# Patient Record
Sex: Female | Born: 1985 | Race: Black or African American | Hispanic: No | Marital: Single | Smoking: Never smoker
Health system: Southern US, Community
[De-identification: ages and names within clinical notes are randomized; demographics above are authoritative.]

## PROBLEM LIST (undated history)

## (undated) DIAGNOSIS — R42 Dizziness and giddiness: Secondary | ICD-10-CM

## (undated) HISTORY — PX: TUBAL LIGATION: SHX77

---

## 2008-08-03 ENCOUNTER — Ambulatory Visit: Payer: Self-pay | Admitting: Diagnostic Radiology

## 2008-08-03 ENCOUNTER — Emergency Department (HOSPITAL_BASED_OUTPATIENT_CLINIC_OR_DEPARTMENT_OTHER): Admission: EM | Admit: 2008-08-03 | Discharge: 2008-08-03 | Payer: Self-pay | Admitting: Emergency Medicine

## 2008-10-02 ENCOUNTER — Emergency Department (HOSPITAL_BASED_OUTPATIENT_CLINIC_OR_DEPARTMENT_OTHER): Admission: EM | Admit: 2008-10-02 | Discharge: 2008-10-02 | Payer: Self-pay | Admitting: Emergency Medicine

## 2008-12-25 ENCOUNTER — Emergency Department (HOSPITAL_BASED_OUTPATIENT_CLINIC_OR_DEPARTMENT_OTHER): Admission: EM | Admit: 2008-12-25 | Discharge: 2008-12-25 | Payer: Self-pay | Admitting: Emergency Medicine

## 2009-01-14 ENCOUNTER — Ambulatory Visit: Payer: Self-pay | Admitting: Diagnostic Radiology

## 2009-01-14 ENCOUNTER — Emergency Department (HOSPITAL_BASED_OUTPATIENT_CLINIC_OR_DEPARTMENT_OTHER): Admission: EM | Admit: 2009-01-14 | Discharge: 2009-01-14 | Payer: Self-pay | Admitting: Emergency Medicine

## 2009-07-21 ENCOUNTER — Ambulatory Visit (HOSPITAL_COMMUNITY): Admission: RE | Admit: 2009-07-21 | Discharge: 2009-07-21 | Payer: Self-pay | Admitting: Obstetrics and Gynecology

## 2009-07-26 ENCOUNTER — Ambulatory Visit (HOSPITAL_COMMUNITY): Admission: RE | Admit: 2009-07-26 | Discharge: 2009-07-26 | Payer: Self-pay | Admitting: Obstetrics and Gynecology

## 2009-07-28 ENCOUNTER — Ambulatory Visit (HOSPITAL_COMMUNITY): Admission: RE | Admit: 2009-07-28 | Discharge: 2009-07-28 | Payer: Self-pay | Admitting: Obstetrics and Gynecology

## 2009-08-05 ENCOUNTER — Ambulatory Visit (HOSPITAL_COMMUNITY): Admission: RE | Admit: 2009-08-05 | Discharge: 2009-08-05 | Payer: Self-pay | Admitting: Obstetrics and Gynecology

## 2010-02-26 ENCOUNTER — Encounter: Payer: Self-pay | Admitting: Obstetrics and Gynecology

## 2010-03-10 ENCOUNTER — Ambulatory Visit (HOSPITAL_COMMUNITY)
Admission: RE | Admit: 2010-03-10 | Discharge: 2010-03-10 | Disposition: A | Discharge status: Home / Self Care | Payer: Medicaid Other | Source: Ambulatory Visit | Attending: Obstetrics and Gynecology | Admitting: Obstetrics and Gynecology

## 2010-03-10 DIAGNOSIS — N751 Abscess of Bartholin's gland: Secondary | ICD-10-CM | POA: Insufficient documentation

## 2010-03-10 LAB — TYPE AND SCREEN
ABO/RH(D): B POS
Antibody Screen: NEGATIVE

## 2010-03-11 LAB — GC/CHLAMYDIA PROBE AMP, GENITAL
Chlamydia, DNA Probe: NEGATIVE
GC Probe Amp, Genital: NEGATIVE

## 2010-03-13 LAB — WOUND CULTURE: Gram Stain: NONE SEEN

## 2010-03-30 NOTE — Op Note (Signed)
  NAMEINITA, URAM            ACCOUNT NO.:  [id number]  MEDICAL RECORD NO.:  [id number]           PATIENT TYPE:  O  LOCATION:  [facility]                          FACILITY:  [facility]  PHYSICIAN:  Pieter Partridge, MD   DATE OF BIRTH:  1985-02-22  DATE OF PROCEDURE:  03/10/2010 DATE OF DISCHARGE:                              OPERATIVE REPORT   PREOPERATIVE DIAGNOSIS:  Right Bartholin cyst.  POSTOPERATIVE DIAGNOSIS:  Right Bartholin cyst.  PROCEDURE:  Bartholin marsupialization.  SURGEON:  Pieter Partridge, MD  ASSISTANT:  Technician.  ANESTHESIA:  General, local.  FINDINGS:  Yellow mucoid discharge expelled from 3-cm cyst.  Gonorrhea and Chlamydia anaerobic cultures collected  ESTIMATED BLOOD LOSS:  Minimal.  COMPLICATIONS:  None.  The patient to PACU in stable condition.  INDICATIONS:  Ms. Manges is a 25 year old gravida 2, para 2 who 2 weeks ago had a right Bartholin abscess.  She went to an Urgent Care and had it drained.  She presented back to the office for annual exam and still there was mild discomfort, this is her third cyst.  She desired definitive management.  PROCEDURE IN DETAIL:  She was identified in the holding area and taken to the operating room with IV running.  She was then prepped and draped in a normal sterile fashion in the dorsal lithotomy position.  Her bladder was emptied and 150 mL of an adequate amount was returned.  Attention was turned to her right labia.  Her Bartholin cyst was identified and less than 5 mL of 1% lidocaine was used to inject the area.  Scalpel was then used to incise the cyst and the incision was then extended with the Metzenbaum scissors.  There was copious amount of yellow mucoid discharge that was expelled.  Cultures were obtained.  The cyst was explored and opened in its entirety.  The cyst wall and the mucosa were then reapproximated in a running fashion with 2-0 Vicryl and this was done on each side of the cyst  wall.  The area was then irrigated.  There was no bleeding noted at the end of the case.  The patient tolerated the procedure well.  She received Ancef 1 g prior to procedure.  She did have SCDs on.  All instrument, sponge, and needle counts were correct x3.  The patient was taken to the operating room in stable condition.     Pieter Partridge, MD     EBV/MEDQ  D:  03/10/2010  T:  03/11/2010  Job:  [id number]  Electronically Signed by Geryl Rankins MD on 03/30/2010 08:37:17 AM

## 2010-05-09 LAB — CBC
MCHC: 33.8 g/dL (ref 30.0–36.0)
MCV: 87.2 fL (ref 78.0–100.0)
Platelets: 141 10*3/uL — ABNORMAL LOW (ref 150–400)
RBC: 3.76 MIL/uL — ABNORMAL LOW (ref 3.87–5.11)

## 2010-05-09 LAB — DIFFERENTIAL
Basophils Absolute: 0.1 10*3/uL (ref 0.0–0.1)
Eosinophils Absolute: 0 10*3/uL (ref 0.0–0.7)
Lymphocytes Relative: 23 % (ref 12–46)
Monocytes Relative: 7 % (ref 3–12)

## 2010-05-09 LAB — URINE MICROSCOPIC-ADD ON

## 2010-05-09 LAB — BASIC METABOLIC PANEL
BUN: 14 mg/dL (ref 6–23)
CO2: 26 mEq/L (ref 19–32)
Creatinine, Ser: 0.8 mg/dL (ref 0.4–1.2)
GFR calc non Af Amer: >60 mL/min (ref >60)
Potassium: 3.9 mEq/L (ref 3.5–5.1)
Sodium: 140 mEq/L (ref 135–145)

## 2010-05-09 LAB — URINALYSIS, ROUTINE W REFLEX MICROSCOPIC
Bilirubin Urine: NEGATIVE
Ketones, ur: NEGATIVE mg/dL
Protein, ur: 100 mg/dL — AB
pH: 6.5 (ref 5.0–8.0)

## 2010-05-09 LAB — WET PREP, GENITAL

## 2010-05-09 LAB — ABO/RH: ABO/RH(D): B POS

## 2010-05-09 LAB — GC/CHLAMYDIA PROBE AMP, GENITAL: Chlamydia, DNA Probe: NEGATIVE

## 2010-05-10 LAB — URINALYSIS, ROUTINE W REFLEX MICROSCOPIC
Bilirubin Urine: NEGATIVE
Ketones, ur: NEGATIVE mg/dL
Nitrite: NEGATIVE
Specific Gravity, Urine: 1.021 (ref 1.005–1.030)
Urobilinogen, UA: 0.2 mg/dL (ref 0.0–1.0)

## 2010-05-10 LAB — URINE MICROSCOPIC-ADD ON

## 2010-05-10 LAB — PREGNANCY, URINE: Preg Test, Ur: POSITIVE

## 2010-05-13 LAB — PREGNANCY, URINE: Preg Test, Ur: NEGATIVE

## 2010-05-13 LAB — URINALYSIS, ROUTINE W REFLEX MICROSCOPIC
Nitrite: NEGATIVE
Specific Gravity, Urine: 1.03 (ref 1.005–1.030)
pH: 7 (ref 5.0–8.0)

## 2010-05-13 LAB — URINE MICROSCOPIC-ADD ON

## 2010-05-13 LAB — WET PREP, GENITAL

## 2014-02-13 ENCOUNTER — Emergency Department (HOSPITAL_BASED_OUTPATIENT_CLINIC_OR_DEPARTMENT_OTHER)
Admission: EM | Admit: 2014-02-13 | Discharge: 2014-02-13 | Disposition: A | Discharge status: Home / Self Care | Payer: BLUE CROSS/BLUE SHIELD | Attending: Emergency Medicine | Admitting: Emergency Medicine

## 2014-02-13 ENCOUNTER — Encounter (HOSPITAL_BASED_OUTPATIENT_CLINIC_OR_DEPARTMENT_OTHER): Payer: Self-pay | Admitting: Emergency Medicine

## 2014-02-13 DIAGNOSIS — N750 Cyst of Bartholin's gland: Secondary | ICD-10-CM | POA: Insufficient documentation

## 2014-02-13 DIAGNOSIS — R21 Rash and other nonspecific skin eruption: Secondary | ICD-10-CM | POA: Insufficient documentation

## 2014-02-13 DIAGNOSIS — Z3202 Encounter for pregnancy test, result negative: Secondary | ICD-10-CM | POA: Diagnosis not present

## 2014-02-13 LAB — CBC WITH DIFFERENTIAL/PLATELET
BASOS ABS: 0 10*3/uL (ref 0.0–0.1)
BASOS PCT: 0 % (ref 0–1)
Eosinophils Absolute: 0.1 10*3/uL (ref 0.0–0.7)
Eosinophils Relative: 3 % (ref 0–5)
HEMATOCRIT: 37 % (ref 36.0–46.0)
HEMOGLOBIN: 12.1 g/dL (ref 12.0–15.0)
LYMPHS ABS: 1.7 10*3/uL (ref 0.7–4.0)
LYMPHS PCT: 39 % (ref 12–46)
MCH: 29.1 pg (ref 26.0–34.0)
MCHC: 32.7 g/dL (ref 30.0–36.0)
MCV: 88.9 fL (ref 78.0–100.0)
MONO ABS: 0.4 10*3/uL (ref 0.1–1.0)
Monocytes Relative: 10 % (ref 3–12)
Neutro Abs: 2.1 10*3/uL (ref 1.7–7.7)
Neutrophils Relative %: 48 % (ref 43–77)
Platelets: 137 10*3/uL — ABNORMAL LOW (ref 150–400)
RBC: 4.16 MIL/uL (ref 3.87–5.11)
RDW: 11.7 % (ref 11.5–15.5)
WBC: 4.3 10*3/uL (ref 4.0–10.5)

## 2014-02-13 LAB — URINALYSIS, ROUTINE W REFLEX MICROSCOPIC
Bilirubin Urine: NEGATIVE
GLUCOSE, UA: NEGATIVE mg/dL
KETONES UR: NEGATIVE mg/dL
LEUKOCYTES UA: NEGATIVE
Nitrite: NEGATIVE
Protein, ur: 30 mg/dL — AB
Specific Gravity, Urine: 1.031 — ABNORMAL HIGH (ref 1.005–1.030)
UROBILINOGEN UA: 1 mg/dL (ref 0.0–1.0)
pH: 7.5 (ref 5.0–8.0)

## 2014-02-13 LAB — COMPREHENSIVE METABOLIC PANEL
ALBUMIN: 4.2 g/dL (ref 3.5–5.2)
ALK PHOS: 54 U/L (ref 39–117)
ALT: 15 U/L (ref 0–35)
AST: 19 U/L (ref 0–37)
Anion gap: 5 (ref 5–15)
BILIRUBIN TOTAL: 0.4 mg/dL (ref 0.3–1.2)
BUN: 18 mg/dL (ref 6–23)
CO2: 25 mmol/L (ref 19–32)
CREATININE: 0.78 mg/dL (ref 0.50–1.10)
Calcium: 8.8 mg/dL (ref 8.4–10.5)
Chloride: 107 mEq/L (ref 96–112)
GFR calc non Af Amer: >90 mL/min (ref >90)
Glucose, Bld: 78 mg/dL (ref 70–99)
Potassium: 3.5 mmol/L (ref 3.5–5.1)
Sodium: 137 mmol/L (ref 135–145)
TOTAL PROTEIN: 7.2 g/dL (ref 6.0–8.3)

## 2014-02-13 LAB — URINE MICROSCOPIC-ADD ON

## 2014-02-13 LAB — PREGNANCY, URINE: Preg Test, Ur: NEGATIVE

## 2014-02-13 LAB — WET PREP, GENITAL
TRICH WET PREP: NONE SEEN
Yeast Wet Prep HPF POC: NONE SEEN

## 2014-02-13 NOTE — ED Provider Notes (Signed)
CSN: [id number]     Arrival date & time 02/13/14  1532 History   First MD Initiated Contact with Patient 02/13/14 1650     Chief Complaint  Patient presents with  . Rash     (Consider location/radiation/quality/duration/timing/severity/associated sxs/prior Treatment) HPI   Christine Bennett is a 29 y.o. female complaining of rash which she noticed 2 days ago to the chest, is nonpruritic, nonpainful, patient states she would not know was there and she was looking at it. When she was taking off her close last night she noticed it was on her legs in addition to her arms. Patient's been in her normal state of health, afebrile, eating and drinking normally. She states that she had a headache which she has intermittently, that was typical for her. She states that migraines run in the family. She denies cervicalgia, cough, easy bruising or bleeding, fever, chills, night sweats, new clothes, tight clothes, trauma or friction to the affected area, abnormal vaginal discharge, abdominal pain.   History reviewed. No pertinent past medical history. History reviewed. No pertinent past surgical history. No family history on file. History  Substance Use Topics  . Smoking status: Not on file  . Smokeless tobacco: Not on file  . Alcohol Use: Not on file   OB History    No data available     Review of Systems  10 systems reviewed and found to be negative, except as noted in the HPI.   Allergies  Review of patient's allergies indicates no known allergies.  Home Medications   Prior to Admission medications   Not on File   BP 107/61 mmHg  Pulse 75  Temp(Src) 98.9 F (37.2 C) (Oral)  Resp 18  Wt 98 lb (44.453 kg)  SpO2 98%  LMP 02/12/2014 Physical Exam  Constitutional: She is oriented to person, place, and time. She appears well-developed and well-nourished. No distress.  Well-appearing  HENT:  Head: Normocephalic.  Mouth/Throat: Oropharynx is clear and moist.  Eyes: Conjunctivae and EOM  are normal. Pupils are equal, round, and reactive to light.  Neck: Normal range of motion. Neck supple.  Cardiovascular: Normal rate, regular rhythm and intact distal pulses.   Pulmonary/Chest: Effort normal and breath sounds normal. No stridor.  Abdominal: Soft. Bowel sounds are normal.  Genitourinary:  Pelvic exam a chaperoned by nurse Christine Bennett: No rashes or lesions, there is a nontender 1 cm left-sided Bartholin's gland cyst with no warmth, erythema or discharge. There is dark blood pooled in the posterior fourchette, no cervical motion or adnexal tenderness.  Musculoskeletal: Normal range of motion.  Neurological: She is alert and oriented to person, place, and time.  Skin: Rash noted.  Scattered, pinpoint, petechial lesions over all 4 extremities and torso. They are more numerous and tightly grouped on the medial legs.  Psychiatric: She has a normal mood and affect.  Nursing note and vitals reviewed.   ED Course  Procedures (including critical care time) Labs Review Labs Reviewed  WET PREP, GENITAL - Abnormal; Notable for the following:    Clue Cells Wet Prep HPF POC FEW (*)    WBC, Wet Prep HPF POC MODERATE (*)    All other components within normal limits  CBC WITH DIFFERENTIAL - Abnormal; Notable for the following:    Platelets 137 (*)    All other components within normal limits  URINALYSIS, ROUTINE W REFLEX MICROSCOPIC - Abnormal; Notable for the following:    Specific Gravity, Urine 1.031 (*)    Hgb urine dipstick MODERATE (*)  Protein, ur 30 (*)    All other components within normal limits  URINE MICROSCOPIC-ADD ON - Abnormal; Notable for the following:    Bacteria, UA FEW (*)    All other components within normal limits  GC/CHLAMYDIA PROBE AMP  COMPREHENSIVE METABOLIC PANEL  PREGNANCY, URINE    Imaging Review No results found.   EKG Interpretation None      MDM   Final diagnoses:  Rash and nonspecific skin eruption  Cyst of left Bartholin's gland     Filed Vitals:   02/13/14 1539 02/13/14 1845 02/13/14 2000  BP: 131/79 105/62 107/61  Pulse: 79 76 75  Temp: 98.9 F (37.2 C)    TempSrc: Oral    Resp: 18 16 18   Weight: 98 lb (44.453 kg)    SpO2: 99% 100% 98%   Christine MealyKendra Fogel is a pleasant 29 y.o. female presenting with petechial rash which started on chest and has now spread to all 4 extremities and lower on the torso. Systemically well, afebrile, no headache, confusion, cervicalgia suggestive of meningitis.  Blood work with normal platelet count. Pelvic exam is undertaken to evaluate for disseminated GC. No significant abnormality on pelvic exam, she is actively menstruating. There is a small left-sided Bartholin's gland cyst (states that she had it lanced 4 years ago by her OB/GYN Dr. Chilton SiGreen), and is not overtly infected.  Petechial rash is concerning, however I do not think she necessitates a LP. Extensive discussion of return precautions with patient and her mother.  This is a shared visit with the attending physician who personally evaluated the patient and agrees with the care plan.   Evaluation does not show pathology that would require ongoing emergent intervention or inpatient treatment. Pt is hemodynamically stable and mentating appropriately. Discussed findings and plan with patient/guardian, who agrees with care plan. All questions answered. Return precautions discussed and outpatient follow up given.        Wynetta Emeryicole Milind Raether, PA-C 02/13/14 2109  Gilda Creasehristopher J. Pollina, MD 02/14/14 [phone]

## 2014-02-13 NOTE — ED Notes (Signed)
PT presents to ED with complaints of petechia to both arms legs and chest that have been there for 2 days on chest and last night on legs..Marland Kitchen

## 2014-02-13 NOTE — Discharge Instructions (Signed)
Do not hesitate to return to the emergency room for any new, worsening or concerning symptoms. ° °Please obtain primary care using resource guide below. But the minute you were seen in the emergency room and that they will need to obtain records for further outpatient management. ° ° ° °Emergency Department Resource Guide °1) Find a Doctor and Pay Out of Pocket °Although you won't have to find out who is covered by your insurance plan, it is a good idea to ask around and get recommendations. You will then need to call the office and see if the doctor you have chosen will accept you as a new patient and what types of options they offer for patients who are self-pay. Some doctors offer discounts or will set up payment plans for their patients who do not have insurance, but you will need to ask so you aren't surprised when you get to your appointment. ° °2) Contact Your Local Health Department °Not all health departments have doctors that can see patients for sick visits, but many do, so it is worth a call to see if yours does. If you don't know where your local health department is, you can check in your phone book. The CDC also has a tool to help you locate your state's health department, and many state websites also have listings of all of their local health departments. ° °3) Find a Walk-in Clinic °If your illness is not likely to be very severe or complicated, you may want to try a walk in clinic. These are popping up all over the country in pharmacies, drugstores, and shopping centers. They're usually staffed by nurse practitioners or physician assistants that have been trained to treat common illnesses and complaints. They're usually fairly quick and inexpensive. However, if you have serious medical issues or chronic medical problems, these are probably not your best option. ° °No Primary Care Doctor: °- Call Health Connect at  832-8000 - they can help you locate a primary care doctor that  accepts your  insurance, provides certain services, etc. °- Physician Referral Service- 1-800-533-3463 ° °Chronic Pain Problems: °Organization         Address  Phone   Notes  °[facility]  (336) 297-2271 Patients need to be referred by their primary care doctor.  ° °Medication Assistance: °Organization         Address  Phone   Notes  °[place] [facility] [address] °[place], [place] [address] (336) 641-8030 --Must be a resident of [place] °-- Must have NO insurance coverage whatsoever (no Medicaid/ Medicare, etc.) °-- The pt. MUST have a primary care doctor that directs their care regularly and follows them in the community °  °[facility]  (866) 331-1348   °[employer]  (888) 892-1162   ° °Agencies that provide inexpensive medical care: °Organization         Address  Phone   Notes  °[facility]  (336) 832-8035   °[facility]    (336) 832-7272   °[facility] [address] °[place], [place] [address] (336) 832-4777   °[facility] [address], °[place] (336) 271-4999   °[facility]    (336) 373-0678   °[facility]    (336) 272-1050   °[facility] ° [address], [place] Phone:  (336) 832-4444, Fax:  (336) 832-4440 Hours of Operation:  9 am -   6 pm, M-F.  Also accepts Medicaid/Medicare and self-pay.  °[facility] ° [address], [place] Phone: (336) 832-3150, Fax: (336) 832-3151. Hours of Operation:  8:30 am - 5:30 pm, M-F.  Also accepts Medicaid and self-pay.  °[facility] [place] [address], [place] Phone: (336) 878-6027   °[facility] [address], [place], [place] (336)723-1848, Ext. 123 Mondays & Thursdays: 7-9 AM.  First 15 patients are seen on a first come, first serve basis. °  ° °Medicaid-accepting [place] Providers: ° °Organization          Address  Phone   Notes  °[facility] [address], [place] (336) 641-2100 Also accepts self-pay patients.  °[facility] [address], [place] ° (336) 856-9996   °[facility] [address], [place] (336) 288-8857   °[facility] [address] [place] [address], [place] (336) 299-7000   °Veita Bland [address], [place]  ° (336) 373-1557 Only accepts [place] Access Medicaid patients after they have their name applied to their card.  ° °Self-Pay (no insurance) in [place]: ° °Organization         Address  Phone   Notes  °Sickle Cell Patients, [facility] [address], [place] (336) 832-1970   °[facility] [address], [place] (336) 832-4400   °[facility] ° [address], [place] (336) 992-4800   °[facility]/Dr. Osei-Bonsu ° [address] [place] [address], [place] or [address], [place] (336) 841-8500 Phone number for both [place] and [place] locations is the same.  °[facility] [address], [place] (336) 299-0000   °[facility] [address] [place] [address], [place] or [address] (336) 852-7530 °(336) 878-2260   °[facility] [address], [place] (336) 350-1642, phone; (336) 294-5005, fax Sees patients 1st and 3rd Saturday of every month.  Must not qualify for public or private insurance (i.e. Medicaid, Medicare, [place] Health Choice, Veterans' Benefits) • Household income should be no more than 200% of the poverty level •The clinic cannot treat you if you are pregnant or think you are pregnant • Sexually transmitted diseases are not treated at the clinic.  ° ° °Dental Care: °Organization         Address  Phone  Notes  °[place] [facility] [address],  [place] (336) 641-6152 Accepts children up to age 21 who are enrolled in Medicaid or [place] Health Choice; pregnant women with a Medicaid card; and children who have applied for Medicaid or [place] Health Choice, but were declined, whose parents can pay a reduced fee at time of service.  °[facility] [place]  [address], [place] (336) 641-7733 Accepts children up to age 21 who are enrolled in Medicaid or [place] Health Choice; pregnant women with a Medicaid card; and children who have applied for Medicaid or [place] Health Choice, but were declined, whose parents can pay a reduced fee at time of service.  °[facility] ° [address],  (336) 641-4533 Patients are seen by appointment only. Walk-ins are not accepted. [facility] will see patients 18 years of age and   older. °Monday - Tuesday (8am-5pm) °Most Wednesdays (8:30-5pm) °$30 per visit, cash only  °[facility] ° [address], [place] (336) 641-4533 Patients are seen by appointment only. Walk-ins are not accepted. [facility] will see patients 18 years of age and older. °One Wednesday Evening (Monthly: Volunteer Based).  $30 per visit, cash only  °[facility]  (919) 537-3737 for adults; Children under age 4, call Graduate Pediatric Dentistry at ([phone]) [phone]. Children aged 4-14, please call ([phone]) [phone] to request a pediatric application. ° Dental services are provided in all areas of dental care including fillings, crowns and bridges, complete and partial dentures, implants, gum treatment, root canals, and extractions. Preventive care is also provided. Treatment is provided to both adults and children. °Patients are selected via a lottery and there is often a waiting list. °  °[facility] [address], °[place] ° (336) 763-8833 [email or web address] °  °[facility] [address], [place], [place]  (336)723-1848, Ext. 123 Second and Fourth Thursday of each month, opens at 6:30 AM; Clinic ends at 9 AM.  Patients are seen on a first-come first-served basis, and a limited number are seen during each clinic.  ° °[facility] ° [address], [place], [place] (336) 723-7904   Eligibility Requirements °You must have lived in [place], [place], or [place] counties for at least the last three months. °  You cannot be eligible for state or federal sponsored healthcare insurance, including Veterans Administration, Medicaid, or Medicare. °  You generally cannot be eligible for healthcare insurance through your employer.  °  How to apply: °Eligibility screenings are held every Tuesday and Wednesday afternoon from 1:00 pm until 4:00 pm. You do not need an appointment for the interview!  °[facility] [address], [place], [place] 336-631-2330   °[facility]  336-342-8273   °[facility]  336-703-3100   °[facility]  336-570-6415   ° °Behavioral Health Resources in the Community: °Intensive Outpatient Programs °Organization         Address  Phone  Notes  °[facility] [address], [place], [place] 336-878-6098   °[facility] [address], [place], [place] 336-832-9800   °[facility] [address], [place], [place] ° 336-882-2125   °[facility] [address],  °[place], [place] 1-800-853-5163 or 336-641-4981   °Substance Abuse Resources °Organization         Address  Phone  Notes  °[facility]  336-882-2125   °[facility]  336-784-9470   °[facility]  336-285-9073   °[facility]  336-845-3988   °Residential & Outpatient Substance Abuse Program  1-800-659-3381   °Psychological Services °Organization         Address  Phone  Notes  °[facility]  336- 832-9600   °[facility]  336- 378-7881    °[facility] [address], [place] 1-800-853-5163 or 336-641-4981   ° °Mobile Crisis Teams °Organization         Address  Phone  Notes  °[facility], Mobile Crisis Care Unit  1-877-626-1772   °Assertive °[facility] ° [address] [place], [place] 336-834-9664   °Sharon DeEsch [address] °[place] 336-554-5454   ° °Self-Help/Support Groups °Organization         Address    Phone             Notes  [facility] - variety of support groups  336- I7437963475-400-4666 Call for more information  Narcotics Anonymous (NA), [facility] [address], [place]  2 meetings at this location   [facility] Organization         Address  Phone  Notes  [facility] [address],    [place]  [phone]   [facility]  [address], [place] [phone], [place], [place] [phone]   [facility] [address], [place] [phone] Admissions: 8am-3pm M-F  [facility] [address]    [place], [place] [phone]   [facility] [address], [place], [place] [phone]) [phone]   [facility] [address]  [place], [place] [phone]   [facility] Intensive Outpatient [address], [place], [place] [phone]   [facility] [address]  [place], [place] [phone] or [phone]   [facility]) [address] [place], [place] [phone] Accepts Medicaid  [facility] [address]  [place] [phone] Substance Abuse/Addiction Treatment   [facility] Organization         Address  Phone  Notes  [facility]  [phone](888) [phone]   Angie FavaJulie Brannon, PhD [address] [place], [place]   ([phone](336) [phone] or (812) [phone](336) [phone]   [facility]   [address] [place], [place] [phone](336) [phone]   [facility] [address], [place], [place] [phone](336) [phone] Insurance/Medicaid/sponsorship through [facility] [address]                                    [place], [place] [phone](336) [phone] Therapy/tele-psych/case  [facility] [address]   [place], [place] [phone]) [phone]    Dr. Lolly MustacheArfeen  [phone](336) [phone]   [facility] [place]  [employer] [facility] 1) [address],  2) [address], [place] 3)  [address], [place] [phone]) [phone](336) [phone]  (825) [phone](336) [phone]   [facility] Child Abuse Hotline (867)116-7585(336) 740-815-8112 or 224 334 4121(336) [phone] (After Hours)       Bartholin's Cyst or Abscess Bartholin's glands are small glands located within the folds of skin (labia) along the sides of the lower opening of the vagina (birth canal). A cyst may develop when the duct of the gland becomes blocked. When this happens, fluid that accumulates within the cyst can become infected. This is known as an abscess. The Bartholin gland produces a mucous fluid to lubricate the outside of the vagina during sexual intercourse. SYMPTOMS   Patients with a small cyst may not have any symptoms.  Mild discomfort to severe pain depending on the size of the cyst and if it is infected (abscess).  Pain, redness, and swelling around the lower opening of the vagina.  Painful intercourse.  Pressure in the perineal area.  Swelling of the lips of the vagina (labia).  The cyst or abscess can be on one side or both sides of the vagina. DIAGNOSIS   A large swelling is seen in the lower vagina area by your caregiver.  Painful to touch.  Redness and pain, if it is  an abscess. TREATMENT   Sometimes the cyst will go away on its own.  Apply warm wet compresses to the area or take hot sitz baths several times a day.  An incision to drain the cyst or abscess with local anesthesia.  Culture the pus,  if it is an abscess.  Antibiotic treatment, if it is an abscess.  Cut open the gland and suture the edges to make the opening of the gland bigger (marsupialization).  Remove the whole gland if the cyst or abscess returns. PREVENTION   Practice good hygiene.  Clean the vaginal area with a mild soap and soft cloth when bathing.  Do not rub hard in the vaginal area when bathing.  Protect the crotch area with a padded cushion if you take long bike rides or ride horses.  Be sure you are well lubricated when you have sexual intercourse. HOME CARE INSTRUCTIONS   If your cyst or abscess was opened, a small piece of gauze, or a drain, may have been placed in the wound to allow drainage. Do not remove this gauze or drain unless directed by your caregiver.  Wear feminine pads, not tampons, as needed for any drainage or bleeding.  If antibiotics were prescribed, take them exactly as directed. Finish the entire course.  Only take over-the-counter or prescription medicines for pain, discomfort, or fever as directed by your caregiver. SEEK IMMEDIATE MEDICAL CARE IF:   You have an increase in pain, redness, swelling, or drainage.  You have bleeding from the wound which results in the use of more than the number of pads suggested by your caregiver in 24 hours.  You have chills.  You have a fever.  You develop any new problems (symptoms) or aggravation of your existing condition. MAKE SURE YOU:   Understand these instructions.  Will watch your condition.  Will get help right away if you are not doing well or get worse. Document Released: 01/22/2005 Document Revised: 04/16/2011 Document Reviewed: 09/10/2007 [facility] Patient Information 2015 [place], [place]. This information is not intended to replace advice given to you by your health care provider. Make sure you discuss any questions you have with your health care provider.

## 2014-02-15 LAB — GC/CHLAMYDIA PROBE AMP
CT PROBE, AMP APTIMA: NEGATIVE
GC PROBE AMP APTIMA: NEGATIVE

## 2015-09-23 ENCOUNTER — Encounter (HOSPITAL_BASED_OUTPATIENT_CLINIC_OR_DEPARTMENT_OTHER): Payer: Self-pay | Admitting: Emergency Medicine

## 2015-09-23 ENCOUNTER — Emergency Department (HOSPITAL_BASED_OUTPATIENT_CLINIC_OR_DEPARTMENT_OTHER)
Admission: EM | Admit: 2015-09-23 | Discharge: 2015-09-23 | Disposition: A | Discharge status: Home / Self Care | Payer: 59 | Attending: Emergency Medicine | Admitting: Emergency Medicine

## 2015-09-23 DIAGNOSIS — J029 Acute pharyngitis, unspecified: Secondary | ICD-10-CM | POA: Insufficient documentation

## 2015-09-23 LAB — RAPID STREP SCREEN (MED CTR MEBANE ONLY): Streptococcus, Group A Screen (Direct): NEGATIVE

## 2015-09-23 NOTE — Discharge Instructions (Signed)
You were seen in the ED today with sore throat. We have performed a rapid strep test that was normal. No antibiotics needed.   Return to the ED with any fever, chills, difficulty breathing, or inability to swallow.

## 2015-09-23 NOTE — ED Triage Notes (Signed)
Aches and chills with sore throat x 2 -3 days

## 2015-09-23 NOTE — ED Provider Notes (Signed)
Emergency Department Provider Note  By signing my name below, I, Christine Bennett, attest that this documentation has been prepared under the direction and in the presence of Maia PlanJoshua G Long, MD. Electronically Signed: Angelene GiovanniEmmanuella Bennett, ED Scribe. 09/23/15. 9:56 PM.   Maia PlanJoshua G Long, MD has reviewed the triage vital signs and the nursing notes.   HISTORY  Chief Complaint Sore Throat   HPI Comments: Christine Bennett is a 30 y.o. female who presents to the Emergency Department complaining of gradually worsening moderate sore throat onset 2 days ago. She reports associated chills and hoarse voice. She adds that she has been able to eat and drink appropriately. No alleviating factors noted. Pt has not tried any medications PTA. She denies any known sick contacts. She denies any fever, trouble swallowing, generalized rash, abdominal pain, or any n/v.   History reviewed. No pertinent past medical history.  There are no active problems to display for this patient.   History reviewed. No pertinent surgical history.    Allergies Review of patient's allergies indicates no known allergies.  History reviewed. No pertinent family history.  Social History Social History  Substance Use Topics  . Smoking status: Never Smoker  . Smokeless tobacco: Never Used  . Alcohol use No    Review of Systems Constitutional: No fever. Eyes: No visual changes. ENT: Sore throat. Cardiovascular: Denies chest pain. Respiratory: Denies shortness of breath. Gastrointestinal: No abdominal pain.  No nausea, no vomiting.  No diarrhea.  No constipation. Genitourinary: Negative for dysuria. Musculoskeletal: Negative for back pain. Skin: Negative for rash. Neurological: Negative for headaches, focal weakness or numbness.  10-point ROS otherwise negative.  ____________________________________________   PHYSICAL EXAM:  VITAL SIGNS: ED Triage Vitals  Enc Vitals Group     BP 09/23/15 2054 120/78     Pulse Rate 09/23/15 2054 87     Resp 09/23/15 2054 18     Temp 09/23/15 2054 98.7 F (37.1 C)     Temp Source 09/23/15 2054 Oral     SpO2 09/23/15 2054 100 %     Weight 09/23/15 2055 108 lb (49 kg)     Height 09/23/15 2055 5\' 5"  (1.651 m)     Pain Score 09/23/15 2051 5   Constitutional: Alert and oriented. Well appearing and in no acute distress. Eyes: Conjunctivae are normal. PERRL. EOMI. Head: Atraumatic. Nose: No congestion/rhinnorhea. Mouth/Throat: Mucous membranes are moist.  Oropharynx with mild diffuse erythema. No PTA. No exudate. Scant anterior adenopathy.  Neck: No stridor.  Cardiovascular: Normal rate, regular rhythm. Good peripheral circulation. Grossly normal heart sounds.   Respiratory: Normal respiratory effort.  No retractions. Lungs CTAB. Gastrointestinal: Soft and nontender. No distention.  Musculoskeletal: No lower extremity tenderness nor edema. No gross deformities of extremities. Neurologic:  Normal speech and language. No gross focal neurologic deficits are appreciated.  Skin:  Skin is warm, dry and intact. No rash noted. Psychiatric: Mood and affect are normal. Speech and behavior are normal.  ____________________________________________ DIAGNOSTIC STUDIES: Oxygen Saturation is 100% on RA, normal by my interpretation.    COORDINATION OF CARE: 9:53 PM- Pt advised of plan for treatment and pt agrees. Pt informed of her rapid strep results. Return discussions discussed.    LABS (all labs ordered are listed, but only abnormal results are displayed)  Labs Reviewed  RAPID STREP SCREEN (NOT AT [facility])  CULTURE, GROUP A STREP [facility]   ____________________________________________   PROCEDURES  Procedure(s) performed:   Procedures  None ____________________________________________   INITIAL IMPRESSION /  ASSESSMENT AND PLAN / ED COURSE  Pertinent labs & imaging results that were available during my care of the patient were reviewed by me and  considered in my medical decision making (see chart for details).  Patient presents to the ED for evaluation of sore throat. No fever, chills, difficulty breathing, or swallowing. Rapid strep negative. Mild posterior pharynx erythema. No PTA or other evidence of abscess. Low suspicion for retropharyngeal abscess or epiglottitis. Plan for discharge home with supportive care and return precautions.   At this time, I do not feel there is any life-threatening condition present. I have reviewed and discussed all results (EKG, imaging, lab, urine as appropriate), exam findings with patient. I have reviewed nursing notes and appropriate previous records.  I feel the patient is safe to be discharged home without further emergent workup. Discussed usual and customary return precautions. Patient and family (if present) verbalize understanding and are comfortable with this plan.  Patient will follow-up with their primary care provider. If they do not have a primary care provider, information for follow-up has been provided to them. All questions have been answered.   ____________________________________________  FINAL CLINICAL IMPRESSION(S) / ED DIAGNOSES  Final diagnoses:  Viral pharyngitis     MEDICATIONS GIVEN DURING THIS VISIT:  None  NEW OUTPATIENT MEDICATIONS STARTED DURING THIS VISIT:  None  Documentation performed with the assistance of a scribe. I have reviewed the documentation for accuracy and made changes as necessary.   Note:  This document was prepared using Dragon voice recognition software and may include unintentional dictation errors.  Alona BeneJoshua Long, MD Emergency Medicine    Maia PlanJoshua G Long, MD 09/24/15 [phone]

## 2015-09-27 LAB — CULTURE, GROUP A STREP (THRC)

## 2019-11-29 ENCOUNTER — Other Ambulatory Visit: Payer: Self-pay

## 2019-11-29 ENCOUNTER — Encounter (HOSPITAL_BASED_OUTPATIENT_CLINIC_OR_DEPARTMENT_OTHER): Payer: Self-pay | Admitting: Emergency Medicine

## 2019-11-29 ENCOUNTER — Emergency Department (HOSPITAL_BASED_OUTPATIENT_CLINIC_OR_DEPARTMENT_OTHER)
Admission: EM | Admit: 2019-11-29 | Discharge: 2019-11-29 | Disposition: A | Discharge status: Home / Self Care | Payer: Medicaid Other | Attending: Emergency Medicine | Admitting: Emergency Medicine

## 2019-11-29 DIAGNOSIS — Z3A11 11 weeks gestation of pregnancy: Secondary | ICD-10-CM | POA: Diagnosis not present

## 2019-11-29 DIAGNOSIS — O210 Mild hyperemesis gravidarum: Secondary | ICD-10-CM | POA: Diagnosis not present

## 2019-11-29 DIAGNOSIS — R1110 Vomiting, unspecified: Secondary | ICD-10-CM | POA: Diagnosis present

## 2019-11-29 LAB — COMPREHENSIVE METABOLIC PANEL
ALT: 12 U/L (ref 0–44)
AST: 16 U/L (ref 15–41)
Albumin: 3.8 g/dL (ref 3.5–5.0)
Alkaline Phosphatase: 46 U/L (ref 38–126)
Anion gap: 9 (ref 5–15)
BUN: 14 mg/dL (ref 6–20)
CO2: 25 mmol/L (ref 22–32)
Calcium: 9.2 mg/dL (ref 8.9–10.3)
Chloride: 99 mmol/L (ref 98–111)
Creatinine, Ser: 0.53 mg/dL (ref 0.44–1.00)
GFR, Estimated: >60 mL/min (ref >60)
Glucose, Bld: 64 mg/dL — ABNORMAL LOW (ref 70–99)
Potassium: 3.6 mmol/L (ref 3.5–5.1)
Sodium: 133 mmol/L — ABNORMAL LOW (ref 135–145)
Total Bilirubin: 0.1 mg/dL — ABNORMAL LOW (ref 0.3–1.2)
Total Protein: 7.8 g/dL (ref 6.5–8.1)

## 2019-11-29 LAB — URINALYSIS, MICROSCOPIC (REFLEX)

## 2019-11-29 LAB — URINALYSIS, ROUTINE W REFLEX MICROSCOPIC
Bilirubin Urine: NEGATIVE
Glucose, UA: NEGATIVE mg/dL
Hgb urine dipstick: NEGATIVE
Ketones, ur: NEGATIVE mg/dL
Nitrite: NEGATIVE
Protein, ur: NEGATIVE mg/dL
Specific Gravity, Urine: 1.025 (ref 1.005–1.030)
pH: 6 (ref 5.0–8.0)

## 2019-11-29 LAB — CBC
HCT: 36.7 % (ref 36.0–46.0)
Hemoglobin: 12.2 g/dL (ref 12.0–15.0)
MCH: 29.2 pg (ref 26.0–34.0)
MCHC: 33.2 g/dL (ref 30.0–36.0)
MCV: 87.8 fL (ref 80.0–100.0)
Platelets: 176 10*3/uL (ref 150–400)
RBC: 4.18 MIL/uL (ref 3.87–5.11)
RDW: 13.1 % (ref 11.5–15.5)
WBC: 7.7 10*3/uL (ref 4.0–10.5)
nRBC: 0 % (ref 0.0–0.2)

## 2019-11-29 LAB — LIPASE, BLOOD: Lipase: 26 U/L (ref 11–51)

## 2019-11-29 MED ORDER — DOXYLAMINE-PYRIDOXINE 10-10 MG PO TBEC
2.0000 | DELAYED_RELEASE_TABLET | Freq: Every day | ORAL | 1 refills | Status: DC
Start: 2019-11-29 — End: 2021-06-04

## 2019-11-29 MED ORDER — DEXTROSE-NACL 5-0.45 % IV SOLN: INTRAVENOUS | Status: DC

## 2019-11-29 MED ORDER — ONDANSETRON HCL 4 MG/2ML IJ SOLN
4.0000 mg | Freq: Once | INTRAMUSCULAR | Status: AC
  Administered 2019-11-29: 4 mg via INTRAVENOUS
  Filled 2019-11-29: qty 2

## 2019-11-29 NOTE — Discharge Instructions (Signed)
You were seen in the emerge department for nausea and vomiting in the setting of early pregnancy.  You had labs done that did not show significant abnormalities other than a low blood sugar.  You were given some fluids and glucose in the department with improvement in your symptoms.  Please contact your OB for close follow-up.  We are starting you on some medication which may help your symptoms.  Please review this with your OB.

## 2019-11-29 NOTE — ED Provider Notes (Signed)
[facility] EMERGENCY DEPARTMENT Provider Note   CSN: [id number] Arrival date & time: 11/29/19  1436     History Chief Complaint  Patient presents with  . Emesis During Pregnancy    Christine Bennett is a 34 y.o. female.  She said she is [redacted] weeks pregnant by ultrasound.  She has been vomiting intermittently for 3 to 4 weeks.  They trialed her on some type of nausea medication but she is not sure the name.  She was at work today at [employer] and felt dizzy lightheaded and decided to come here and get checked out.  She has a follow-up appointment on Friday with her OB.  No abdominal pain vaginal bleeding or discharge.  She has 2 other children and did not have morning sickness with them.  The history is provided by the patient.  Emesis Severity:  Moderate Timing:  Intermittent Quality:  Stomach contents Progression:  Unchanged Chronicity:  New Recent urination:  Decreased Relieved by:  Nothing Worsened by:  Food smell Ineffective treatments:  None tried Associated symptoms: no abdominal pain, no cough, no diarrhea, no fever, no headaches and no sore throat   Risk factors: pregnant   Risk factors: no sick contacts        History reviewed. No pertinent past medical history.  There are no problems to display for this patient.   History reviewed. No pertinent surgical history.   OB History    Gravida  1   Para      Term      Preterm      AB      Living        SAB      TAB      Ectopic      Multiple      Live Births              No family history on file.  Social History   Tobacco Use  . Smoking status: Never Smoker  . Smokeless tobacco: Never Used  Substance Use Topics  . Alcohol use: No  . Drug use: No    Home Medications Prior to Admission medications   Not on File    Allergies    Patient has no known allergies.  Review of Systems   Review of Systems  Constitutional: Negative for fever.  HENT: Negative for sore throat.     Eyes: Negative for visual disturbance.  Respiratory: Negative for cough and shortness of breath.   Cardiovascular: Negative for chest pain.  Gastrointestinal: Positive for nausea and vomiting. Negative for abdominal pain and diarrhea.  Genitourinary: Negative for dysuria.  Musculoskeletal: Negative for back pain.  Skin: Negative for rash.  Neurological: Positive for light-headedness. Negative for headaches.    Physical Exam Updated Vital Signs BP 111/79 (BP Location: Right Arm)   Pulse 92   Temp 98.5 F (36.9 C) (Oral)   Resp 16   Ht 5\' 6"  (1.676 m)   Wt 52.2 kg   SpO2 100%   BMI 18.56 kg/m   Physical Exam Vitals and nursing note reviewed.  Constitutional:      General: She is not in acute distress.    Appearance: Normal appearance. She is well-developed.  HENT:     Head: Normocephalic and atraumatic.  Eyes:     Conjunctiva/sclera: Conjunctivae normal.  Cardiovascular:     Rate and Rhythm: Normal rate and regular rhythm.     Heart sounds: No murmur heard.   Pulmonary:  Effort: Pulmonary effort is normal. No respiratory distress.     Breath sounds: Normal breath sounds.  Abdominal:     Palpations: Abdomen is soft.     Tenderness: There is no abdominal tenderness.  Musculoskeletal:        General: No deformity or signs of injury. Normal range of motion.     Cervical back: Neck supple.  Skin:    General: Skin is warm and dry.  Neurological:     General: No focal deficit present.     Mental Status: She is alert.     ED Results / Procedures / Treatments   Labs (all labs ordered are listed, but only abnormal results are displayed) Labs Reviewed  COMPREHENSIVE METABOLIC PANEL - Abnormal; Notable for the following components:      Result Value   Sodium 133 (*)    Glucose, Bld 64 (*)    Total Bilirubin 0.1 (*)    All other components within normal limits  URINALYSIS, ROUTINE W REFLEX MICROSCOPIC - Abnormal; Notable for the following components:   APPearance  HAZY (*)    Leukocytes,Ua TRACE (*)    All other components within normal limits  URINALYSIS, MICROSCOPIC (REFLEX) - Abnormal; Notable for the following components:   Bacteria, UA MANY (*)    All other components within normal limits  LIPASE, BLOOD  CBC    EKG None  Radiology No results found.  Procedures Procedures (including critical care time)  Medications Ordered in ED Medications  ondansetron (ZOFRAN) injection 4 mg (4 mg Intravenous Given 11/29/19 1632)    ED Course  I have reviewed the triage vital signs and the nursing notes.  Pertinent labs & imaging results that were available during my care of the patient were reviewed by me and considered in my medical decision making (see chart for details).  Clinical Course as of Nov 29 1052  Wynelle Link Nov 29, 2019  1749 Reevaluated patient she has had about a liter in and she said she is feeling little bit better.  Will p.o. challenge.   [MB]    Clinical Course User Index [MB] Terrilee Files, MD   MDM Rules/Calculators/A&P                         This patient complains of nausea and vomiting lightheadedness in early pregnancy; this involves an extensive number of treatment Options and is a complaint that carries with it a high risk of complications and Morbidity. The differential includes hyperemesis gravidarum, metabolic derangements, dehydration, UTI  I ordered, reviewed and interpreted labs, which included CBC with normal white count normal hemoglobin, chemistries fairly normal other than low glucose (patient asymptomatic), urinalysis without obvious signs of infection, normal LFTs and lipase I ordered medication IV fluids and Zofran with improvement in her symptoms  After the interventions stated above, I reevaluated the patient and found patient to be symptomatically improved.  She is tolerating p.o. here.  We will give her a prescription for doxylamine and pyridoxine.  She has follow-up this week with her OB.  Return  instructions discussed.   Final Clinical Impression(s) / ED Diagnoses Final diagnoses:  Hyperemesis gravidarum    Rx / DC Orders ED Discharge Orders         Ordered    Doxylamine-Pyridoxine (DICLEGIS) 10-10 MG TBEC  Daily at bedtime        11/29/19 1827           Terrilee Files, MD  11/30/19 1057  

## 2019-11-29 NOTE — ED Triage Notes (Addendum)
Emesis since finding out she is pregnant. Thinks she is dehydrated. Also endorses headache.

## 2021-06-04 ENCOUNTER — Other Ambulatory Visit: Payer: Self-pay

## 2021-06-04 ENCOUNTER — Encounter (HOSPITAL_BASED_OUTPATIENT_CLINIC_OR_DEPARTMENT_OTHER): Payer: Self-pay | Admitting: Emergency Medicine

## 2021-06-04 ENCOUNTER — Emergency Department (HOSPITAL_BASED_OUTPATIENT_CLINIC_OR_DEPARTMENT_OTHER)
Admission: EM | Admit: 2021-06-04 | Discharge: 2021-06-04 | Disposition: A | Discharge status: Home / Self Care | Payer: BC Managed Care – PPO | Attending: Emergency Medicine | Admitting: Emergency Medicine

## 2021-06-04 ENCOUNTER — Emergency Department (HOSPITAL_BASED_OUTPATIENT_CLINIC_OR_DEPARTMENT_OTHER): Payer: BC Managed Care – PPO

## 2021-06-04 DIAGNOSIS — J029 Acute pharyngitis, unspecified: Secondary | ICD-10-CM | POA: Diagnosis present

## 2021-06-04 DIAGNOSIS — R0981 Nasal congestion: Secondary | ICD-10-CM | POA: Insufficient documentation

## 2021-06-04 DIAGNOSIS — R059 Cough, unspecified: Secondary | ICD-10-CM | POA: Diagnosis not present

## 2021-06-04 HISTORY — DX: Dizziness and giddiness: R42

## 2021-06-04 LAB — CBC WITH DIFFERENTIAL/PLATELET
Abs Immature Granulocytes: 0.01 10*3/uL (ref 0.00–0.07)
Basophils Absolute: 0 10*3/uL (ref 0.0–0.1)
Basophils Relative: 0 %
Eosinophils Absolute: 0 10*3/uL (ref 0.0–0.5)
Eosinophils Relative: 1 %
HCT: 39.6 % (ref 36.0–46.0)
Hemoglobin: 12.9 g/dL (ref 12.0–15.0)
Immature Granulocytes: 0 %
Lymphocytes Relative: 34 %
Lymphs Abs: 1.8 10*3/uL (ref 0.7–4.0)
MCH: 28.2 pg (ref 26.0–34.0)
MCHC: 32.6 g/dL (ref 30.0–36.0)
MCV: 86.7 fL (ref 80.0–100.0)
Monocytes Absolute: 0.5 10*3/uL (ref 0.1–1.0)
Monocytes Relative: 9 %
Neutro Abs: 3 10*3/uL (ref 1.7–7.7)
Neutrophils Relative %: 56 %
Platelets: 188 10*3/uL (ref 150–400)
RBC: 4.57 MIL/uL (ref 3.87–5.11)
RDW: 13.2 % (ref 11.5–15.5)
WBC: 5.4 10*3/uL (ref 4.0–10.5)
nRBC: 0 % (ref 0.0–0.2)

## 2021-06-04 LAB — BASIC METABOLIC PANEL
Anion gap: 7 (ref 5–15)
BUN: 13 mg/dL (ref 6–20)
CO2: 25 mmol/L (ref 22–32)
Calcium: 9 mg/dL (ref 8.9–10.3)
Chloride: 105 mmol/L (ref 98–111)
Creatinine, Ser: 0.91 mg/dL (ref 0.44–1.00)
GFR, Estimated: >60 mL/min (ref >60)
Glucose, Bld: 92 mg/dL (ref 70–99)
Potassium: 4.1 mmol/L (ref 3.5–5.1)
Sodium: 137 mmol/L (ref 135–145)

## 2021-06-04 MED ORDER — FAMOTIDINE 20 MG PO TABS: 20.0000 mg | ORAL_TABLET | Freq: Two times a day (BID) | ORAL | 0 refills | Status: AC

## 2021-06-04 MED ORDER — LORATADINE 10 MG PO TABS: 10.0000 mg | ORAL_TABLET | Freq: Every day | ORAL | 0 refills | Status: AC

## 2021-06-04 MED ORDER — FLUTICASONE PROPIONATE 50 MCG/ACT NA SUSP: 1.0000 | Freq: Every day | NASAL | 0 refills | Status: AC

## 2021-06-04 NOTE — ED Provider Notes (Signed)
[facility]ENC[facility] Provider Note   CSN: [id number] Arrival date & time: 06/04/21  1022     History  Chief Complaint  Patient presents with   Sore Throat    Christine Bennett is a 36 y.o. female.  Patient presents to the emergency department for evaluation of congestion, sore throat and cough which has been ongoing over the past 5 to 6 weeks.  Patient has had 3 urgent/primary care visits in that time.  Initially she was diagnosed with URI after testing negative and was treated conservatively.  In early April she returned and tested positive for strep throat which was treated with 10 days of amoxicillin.  Patient then saw her doctor for continued symptoms and headache as well as some dizziness.  This was treated with meclizine which has seemed to help.  Currently patient only has a mild headache.  She has developed a cough in the interim.  No fevers.  Throat continues to be sore.  She denies history of allergies, asthma, GERD.  No shortness of breath.  No lower extremity swelling.  Patient does have an infant at home who often has similar symptoms.      Home Medications Prior to Admission medications   Medication Sig Start Date End Date Taking? Authorizing Provider  Doxylamine-Pyridoxine (DICLEGIS) 10-10 MG TBEC Take 2 tablets by mouth at bedtime. 11/29/19   Hayden Rasmussen, MD      Allergies    Patient has no known allergies.    Review of Systems   Review of Systems  Physical Exam Updated Vital Signs BP 100/66 (BP Location: Left Arm)   Pulse 84   Temp 98.1 F (36.7 C) (Oral)   Resp 16   Ht 5\' 5"  (1.651 m)   Wt 62.1 kg   LMP 05/21/2021   SpO2 100%   BMI 22.80 kg/m   Physical Exam Vitals and nursing note reviewed.  Constitutional:      Appearance: She is well-developed.  HENT:     Head: Normocephalic and atraumatic.     Jaw: No trismus.     Right Ear: Tympanic membrane, ear canal and external ear normal.     Left Ear: Tympanic membrane,  ear canal and external ear normal.     Nose: Congestion present. No mucosal edema or rhinorrhea.     Mouth/Throat:     Mouth: Mucous membranes are moist. Mucous membranes are not dry. No oral lesions.     Pharynx: Uvula midline. Posterior oropharyngeal erythema present. No oropharyngeal exudate or uvula swelling.     Tonsils: No tonsillar abscesses.  Eyes:     General:        Right eye: No discharge.        Left eye: No discharge.     Conjunctiva/sclera: Conjunctivae normal.  Cardiovascular:     Rate and Rhythm: Normal rate and regular rhythm.     Heart sounds: Normal heart sounds.  Pulmonary:     Effort: Pulmonary effort is normal. No respiratory distress.     Breath sounds: Normal breath sounds. No wheezing or rales.  Abdominal:     Palpations: Abdomen is soft.     Tenderness: There is no abdominal tenderness.  Musculoskeletal:     Cervical back: Normal range of motion and neck supple.  Lymphadenopathy:     Cervical: No cervical adenopathy.  Skin:    General: Skin is warm and dry.  Neurological:     Mental Status: She is alert.  Psychiatric:  Mood and Affect: Mood normal.    ED Results / Procedures / Treatments   Labs (all labs ordered are listed, but only abnormal results are displayed) Labs Reviewed  CBC WITH DIFFERENTIAL/PLATELET  BASIC METABOLIC PANEL    EKG None  Radiology DG Chest 2 View  Result Date: 06/04/2021 CLINICAL DATA:  Cough, sore throat EXAM: CHEST - 2 VIEW COMPARISON:  None. FINDINGS: The heart size and mediastinal contours are within normal limits. Both lungs are clear. No pleural effusion. The visualized skeletal structures are unremarkable. IMPRESSION: No active cardiopulmonary disease. Electronically Signed   By: Macy Mis M.D.   On: 06/04/2021 12:16    Procedures Procedures    Medications Ordered in ED Medications - No data to display  ED Course/ Medical Decision Making/ A&P    Patient seen and examined. History obtained  directly from patient.   Labs/EKG: Ordered CBC, BMP.  Imaging: Ordered chest x-ray.  Medications/Fluids: None ordered  Most recent vital signs reviewed and are as follows: BP 100/66 (BP Location: Left Arm)   Pulse 84   Temp 98.1 F (36.7 C) (Oral)   Resp 16   Ht 5\' 5"  (1.651 m)   Wt 62.1 kg   LMP 05/21/2021   SpO2 100%   BMI 22.80 kg/m   Initial impression: Sore throat, possible recurrent URI.  Given symptoms now, and various forms, and over the past 5 weeks, patient offered additional evaluation with CBC, BMP and a chest x-ray to look for other possible etiologies and for reassurance purposes.  Patient agrees to proceed with this.  If reassuring, will continue empiric treatment.  Will recommend antihistamine and Flonase as well as a trial of Pepcid.  She will need to follow-up with her PCP as well.  12:35 PM Reassessment performed. Patient appears stable.  Labs personally reviewed and interpreted including: CBC normal; BNP normal  Imaging personally visualized and interpreted including: Chest x-ray, agree negative for pneumonia or other problems  Reviewed pertinent lab work and imaging with patient at bedside. Questions answered.   Most current vital signs reviewed and are as follows: BP 100/66 (BP Location: Left Arm)   Pulse 84   Temp 98.1 F (36.7 C) (Oral)   Resp 16   Ht 5\' 5"  (1.651 m)   Wt 62.1 kg   LMP 05/21/2021   SpO2 100%   BMI 22.80 kg/m   Plan: Discharge to home.   Prescriptions written for: Loratadine, Flonase, Pepcid  ED return instructions discussed: Return with worsening trouble breathing, shortness of breath, worsening symptoms or other concerns.  Follow-up instructions discussed: Patient encouraged to follow-up with their PCP in 7 days.                            Medical Decision Making Amount and/or Complexity of Data Reviewed Labs: ordered. Radiology: ordered.   Patient with ongoing upper respiratory symptoms and sore throat, now with  cough.  Patient has had symptoms over the past 5 weeks.  They have been waxing and waning.  She had strep throat at 1 point.  Also evaluate for headache and dizziness.  Considered infectious cause of sore throat today, however patient's exam is not concerning for strep throat.  Also considered other causes such as postnasal drip/allergies, GERD.  Will empirically treat as above.  Patient looks well, nontoxic.  Lab work-up and chest x-ray without any other concerns.        Final Clinical Impression(s) / ED  Diagnoses Final diagnoses:  Sore throat    Rx / DC Orders ED Discharge Orders          Ordered    fluticasone (FLONASE) 50 MCG/ACT nasal spray  Daily        06/04/21 1234    loratadine (CLARITIN) 10 MG tablet  Daily        06/04/21 1234    famotidine (PEPCID) 20 MG tablet  2 times daily        06/04/21 1234              Carlisle Cater, Hershal Coria 06/04/21 1237    Luna Fuse, MD 06/23/21 2157

## 2021-06-04 NOTE — Discharge Instructions (Signed)
Please read and follow all provided instructions. ? ?Your diagnoses today include:  ?1. Sore throat   ? ? ?Tests performed today include: ?Complete blood cell count: Were normal ?Basic metabolic panel: Normal ?Chest x-ray: Clear without signs of pneumonia ?Vital signs. See below for your results today.  ? ?Medications prescribed:  ?Pepcid (famotidine) - antihistamine ? ?You can find this medication over-the-counter.  ? ?DO NOT exceed:  ?20mg  Pepcid every 12 hours ? ?Flonase: Nasal spray for congestion ? ?Loratadine: Antihistamine ? ?Take any prescribed medications only as directed. ? ?Home care instructions:  ?Follow any educational materials contained in this packet. ? ?BE VERY CAREFUL not to take multiple medicines containing Tylenol (also called acetaminophen). Doing so can lead to an overdose which can damage your liver and cause liver failure and possibly death.  ? ?Follow-up instructions: ?Please follow-up with your primary care provider in the next 7 days for further evaluation of your symptoms.  ? ?Return instructions:  ?Please return to the Emergency Department if you experience worsening symptoms.  ?Please return if you have any other emergent concerns. ? ?Additional Information: ? ?Your vital signs today were: ?BP 100/66 (BP Location: Left Arm)   Pulse 84   Temp 98.1 ?F (36.7 ?C) (Oral)   Resp 16   Ht 5\' 5"  (1.651 m)   Wt 62.1 kg   LMP 05/21/2021   SpO2 100%   BMI 22.80 kg/m?  ?If your blood pressure (BP) was elevated above 135/85 this visit, please have this repeated by your doctor within one month. ?-------------- ? ?

## 2021-06-04 NOTE — ED Triage Notes (Signed)
Patient c/o headache, sore throat, and cough for the last month. States has been seen at urgent care for same, has been diagnosed with strep and a sinus infection in the last month.  ?

## 2021-06-10 ENCOUNTER — Other Ambulatory Visit: Payer: Self-pay

## 2021-06-10 ENCOUNTER — Encounter (HOSPITAL_BASED_OUTPATIENT_CLINIC_OR_DEPARTMENT_OTHER): Payer: Self-pay

## 2021-06-10 ENCOUNTER — Emergency Department (HOSPITAL_BASED_OUTPATIENT_CLINIC_OR_DEPARTMENT_OTHER)
Admission: EM | Admit: 2021-06-10 | Discharge: 2021-06-11 | Disposition: A | Discharge status: Home / Self Care | Payer: BC Managed Care – PPO | Attending: Emergency Medicine | Admitting: Emergency Medicine

## 2021-06-10 DIAGNOSIS — J029 Acute pharyngitis, unspecified: Secondary | ICD-10-CM | POA: Diagnosis not present

## 2021-06-10 NOTE — ED Triage Notes (Signed)
Pt states she has had a sore throat for "weeks." Pt reports in the last few hours she feels her throat has gotten worse and become swollen. Pt's airway is patent and is able to swallow secretions. Pt has recently been treated for step and sinus infection.  ?

## 2021-06-11 DIAGNOSIS — J029 Acute pharyngitis, unspecified: Secondary | ICD-10-CM | POA: Diagnosis not present

## 2021-06-11 LAB — GROUP A STREP BY PCR: Group A Strep by PCR: NOT DETECTED

## 2021-06-11 MED ORDER — PANTOPRAZOLE SODIUM 40 MG PO TBEC: 40.0000 mg | DELAYED_RELEASE_TABLET | Freq: Every day | ORAL | 0 refills | Status: AC

## 2021-06-11 MED ORDER — PANTOPRAZOLE SODIUM 40 MG PO TBEC
40.0000 mg | DELAYED_RELEASE_TABLET | Freq: Once | ORAL | Status: AC
  Administered 2021-06-11: 40 mg via ORAL
  Filled 2021-06-11: qty 1

## 2021-06-11 NOTE — ED Provider Notes (Signed)
? ?[facility] ?Provider Note: Lowella Dell, MD, FACEP ? ?CSN: [id number] ?MRN: [id number] ?ARRIVAL: 06/10/21 at 2055 ?ROOM: [facility] ? ? ?CHIEF COMPLAINT  ?Sore Throat ? ? ?HISTORY OF PRESENT ILLNESS  ?06/11/21 12:43 AM ?Christine Bennett is a 36 y.o. female who has had a sore throat for weeks.  She tested positive for strep on 05/17/2021 and was treated for strep throat and sinusitis with 10 days of amoxicillin.  Now for the last she continues to have a sore throat.  She feels like her throat is swollen and is getting worse.  She is able to speak, eat and swallow without difficulty. ? ? ?Past Medical History:  ?Diagnosis Date  ? Vertigo   ? ? ?Past Surgical History:  ?Procedure Laterality Date  ? TUBAL LIGATION    ? ? ?No family history on file. ? ?Social History  ? ?Tobacco Use  ? Smoking status: Never  ? Smokeless tobacco: Never  ?Vaping Use  ? Vaping Use: Never used  ?Substance Use Topics  ? Alcohol use: Yes  ? Drug use: No  ? ? ?Prior to Admission medications   ?Medication Sig Start Date End Date Taking? Authorizing Provider  ?pantoprazole (PROTONIX) 40 MG tablet Take 1 tablet (40 mg total) by mouth daily. 06/11/21  Yes Auria Mckinlay, MD  ?famotidine (PEPCID) 20 MG tablet Take 1 tablet (20 mg total) by mouth 2 (two) times daily. 06/04/21   Renne Crigler, PA-C  ?fluticasone (FLONASE) 50 MCG/ACT nasal spray Place 1 spray into both nostrils daily. 06/04/21   Renne Crigler, PA-C  ?loratadine (CLARITIN) 10 MG tablet Take 1 tablet (10 mg total) by mouth daily. 06/04/21   Renne Crigler, PA-C  ? ? ?Allergies ?Patient has no known allergies. ? ? ?REVIEW OF SYSTEMS  ?Negative except as noted here or in the History of Present Illness. ? ? ?PHYSICAL EXAMINATION  ?Initial Vital Signs ?Blood pressure 105/82, pulse 78, temperature 98.2 ?F (36.8 ?C), temperature source Oral, resp. rate 16, height 5\' 5"  (1.651 m), weight 62.1 kg, last menstrual period 06/08/2021, SpO2 100 %, unknown if currently  breastfeeding. ? ?Examination ?General: Well-developed, well-nourished female in no acute distress; appearance consistent with age of record ?HENT: normocephalic; atraumatic; pharyngeal erythema with slight exudate; uvula midline; no dysphonia; no stridor ?Eyes: pupils equal, round and reactive to light; extraocular muscles intact ?Neck: supple; anterior cervical lymphadenopathy ?Heart: regular rate and rhythm ?Lungs: clear to auscultation bilaterally ?Abdomen: soft; nondistended; nontender; bowel sounds present ?Extremities: No deformity; full range of motion ?Neurologic: Awake, alert and oriented; motor function intact in all extremities and symmetric; no facial droop ?Skin: Warm and dry ?Psychiatric: Normal mood and affect ? ? ?RESULTS  ?Summary of this visit's results, reviewed and interpreted by myself: ? ? EKG Interpretation ? ?Date/Time:    ?Ventricular Rate:    ?PR Interval:    ?QRS Duration:   ?QT Interval:    ?QTC Calculation:   ?R Axis:     ?Text Interpretation:   ?  ? ?  ? ?Laboratory Studies: ?Results for orders placed or performed during the hospital encounter of 06/10/21 (from the past 24 hour(s))  ?Group A Strep by PCR     Status: None  ? Collection Time: 06/11/21 12:52 AM  ? Specimen: Throat; Sterile Swab  ?Result Value Ref Range  ? Group A Strep by PCR NOT DETECTED NOT DETECTED  ? ?Imaging Studies: ?No results found. ? ?ED COURSE and MDM  ?Nursing notes, initial and subsequent vitals signs, including pulse  oximetry, reviewed and interpreted by myself. ? ?Vitals:  ? 06/10/21 2101 06/10/21 2103 06/11/21 0018  ?BP:  (!) 119/93 105/82  ?Pulse:  88 78  ?Resp:  20 16  ?Temp:  98.2 ?F (36.8 ?C)   ?TempSrc:  Oral   ?SpO2:  99% 100%  ?Weight: 62.1 kg    ?Height: 5\' 5"  (1.651 m)    ? ?Medications  ?pantoprazole (PROTONIX) EC tablet 40 mg (has no administration in time range)  ? ?The patient is negative for strep throat which means her course of amoxicillin was likely therapeutic.  This could represent a  viral pharyngitis for which there is no specific treatment other than symptomatic.  She does state this problem is worse at night than in the daytime and she may have acid reflux.  We will trial a PPI.  She was prescribed Pepcid but this does not always fully block acid reflux. ? ?PROCEDURES  ?Procedures ? ? ?ED DIAGNOSES  ? ?  ICD-10-CM   ?1. Sore throat  J02.9   ?  ? ? ? ?  ?Makynlee Kressin, MD ?06/11/21 0126 ? ?

## 2023-05-06 IMAGING — DX DG CHEST 2V
2 series · 2 of 2 positions shown · non-contrast
Comparison: None.

CLINICAL DATA: Cough, sore throat

EXAM:
CHEST - 2 VIEW

[chest pa]
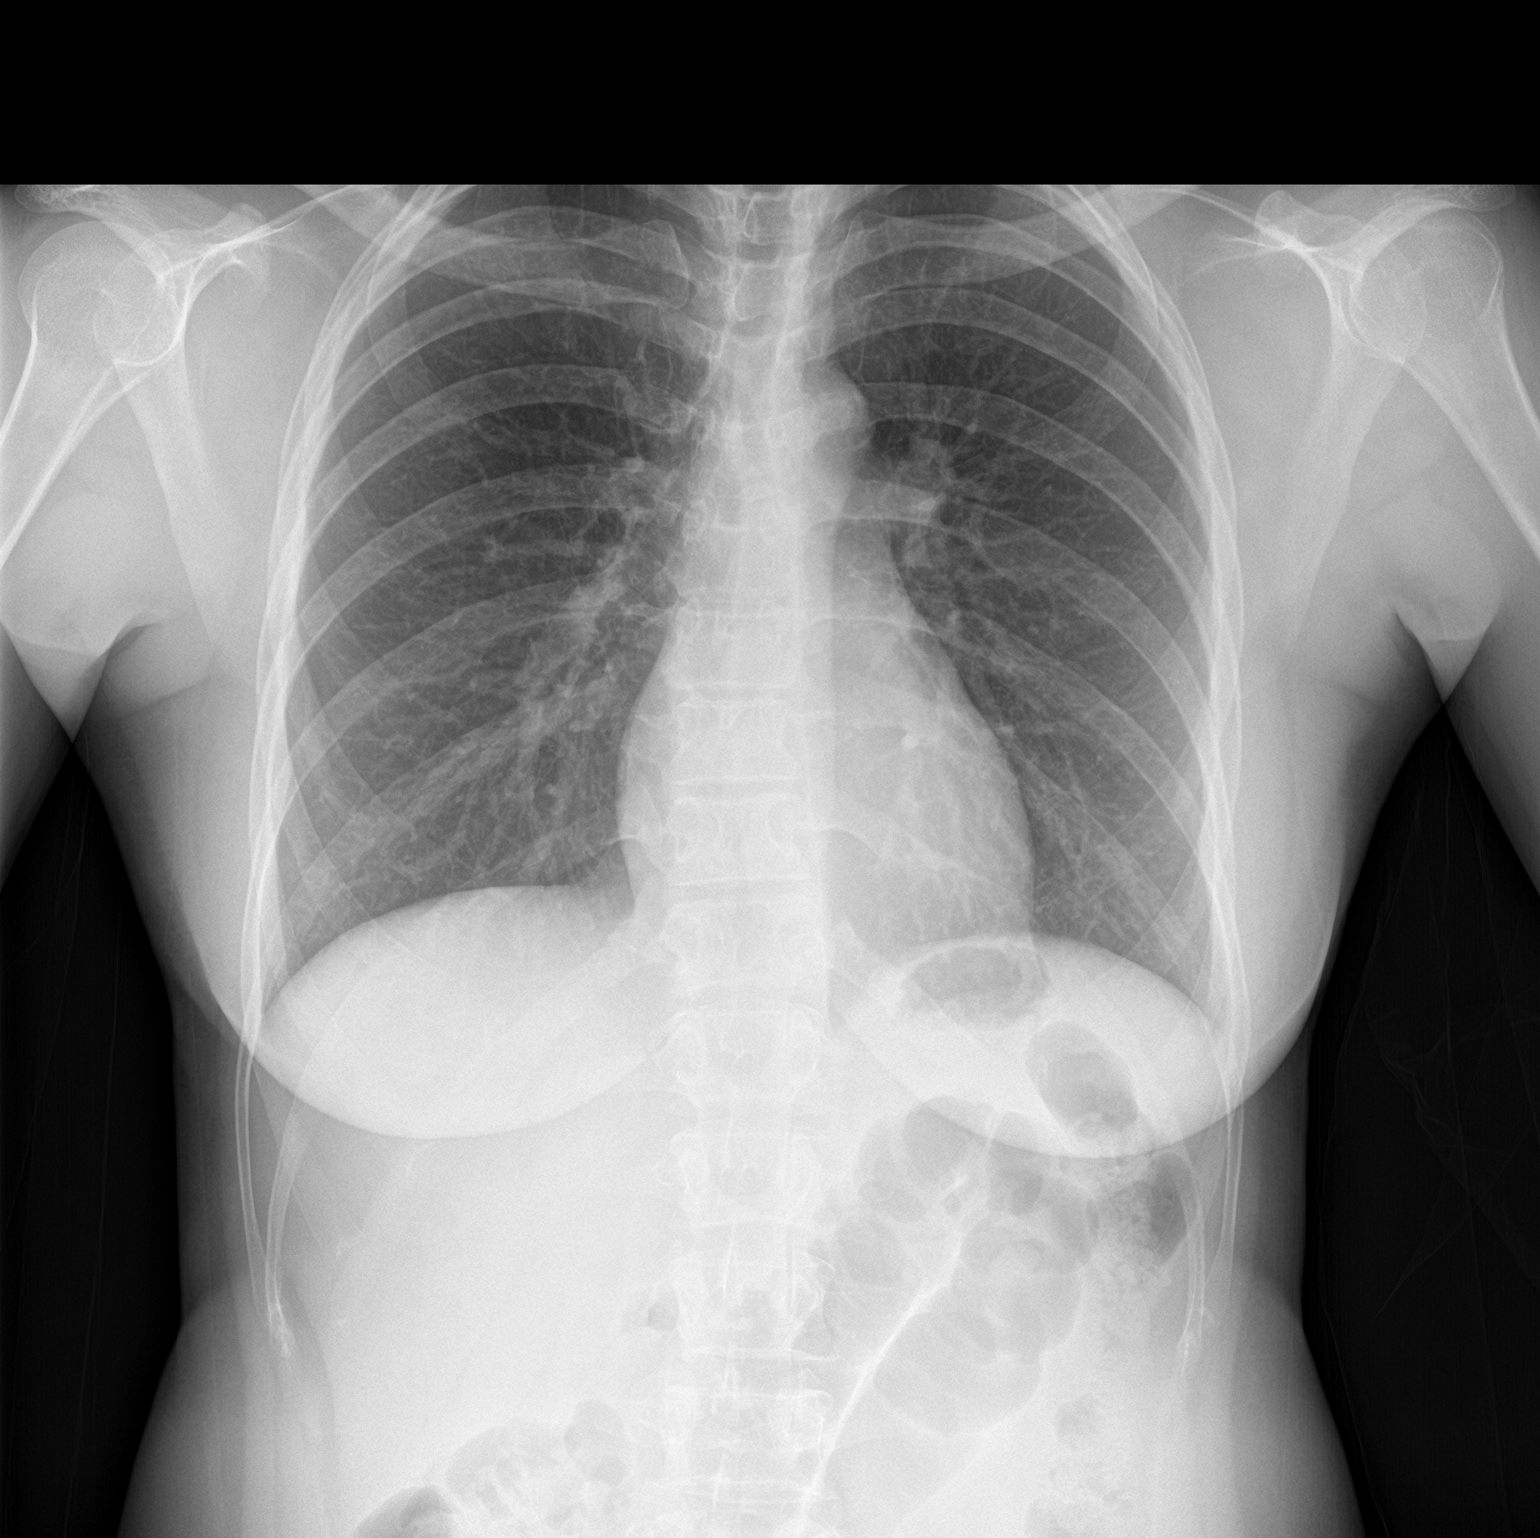

[chest lat]
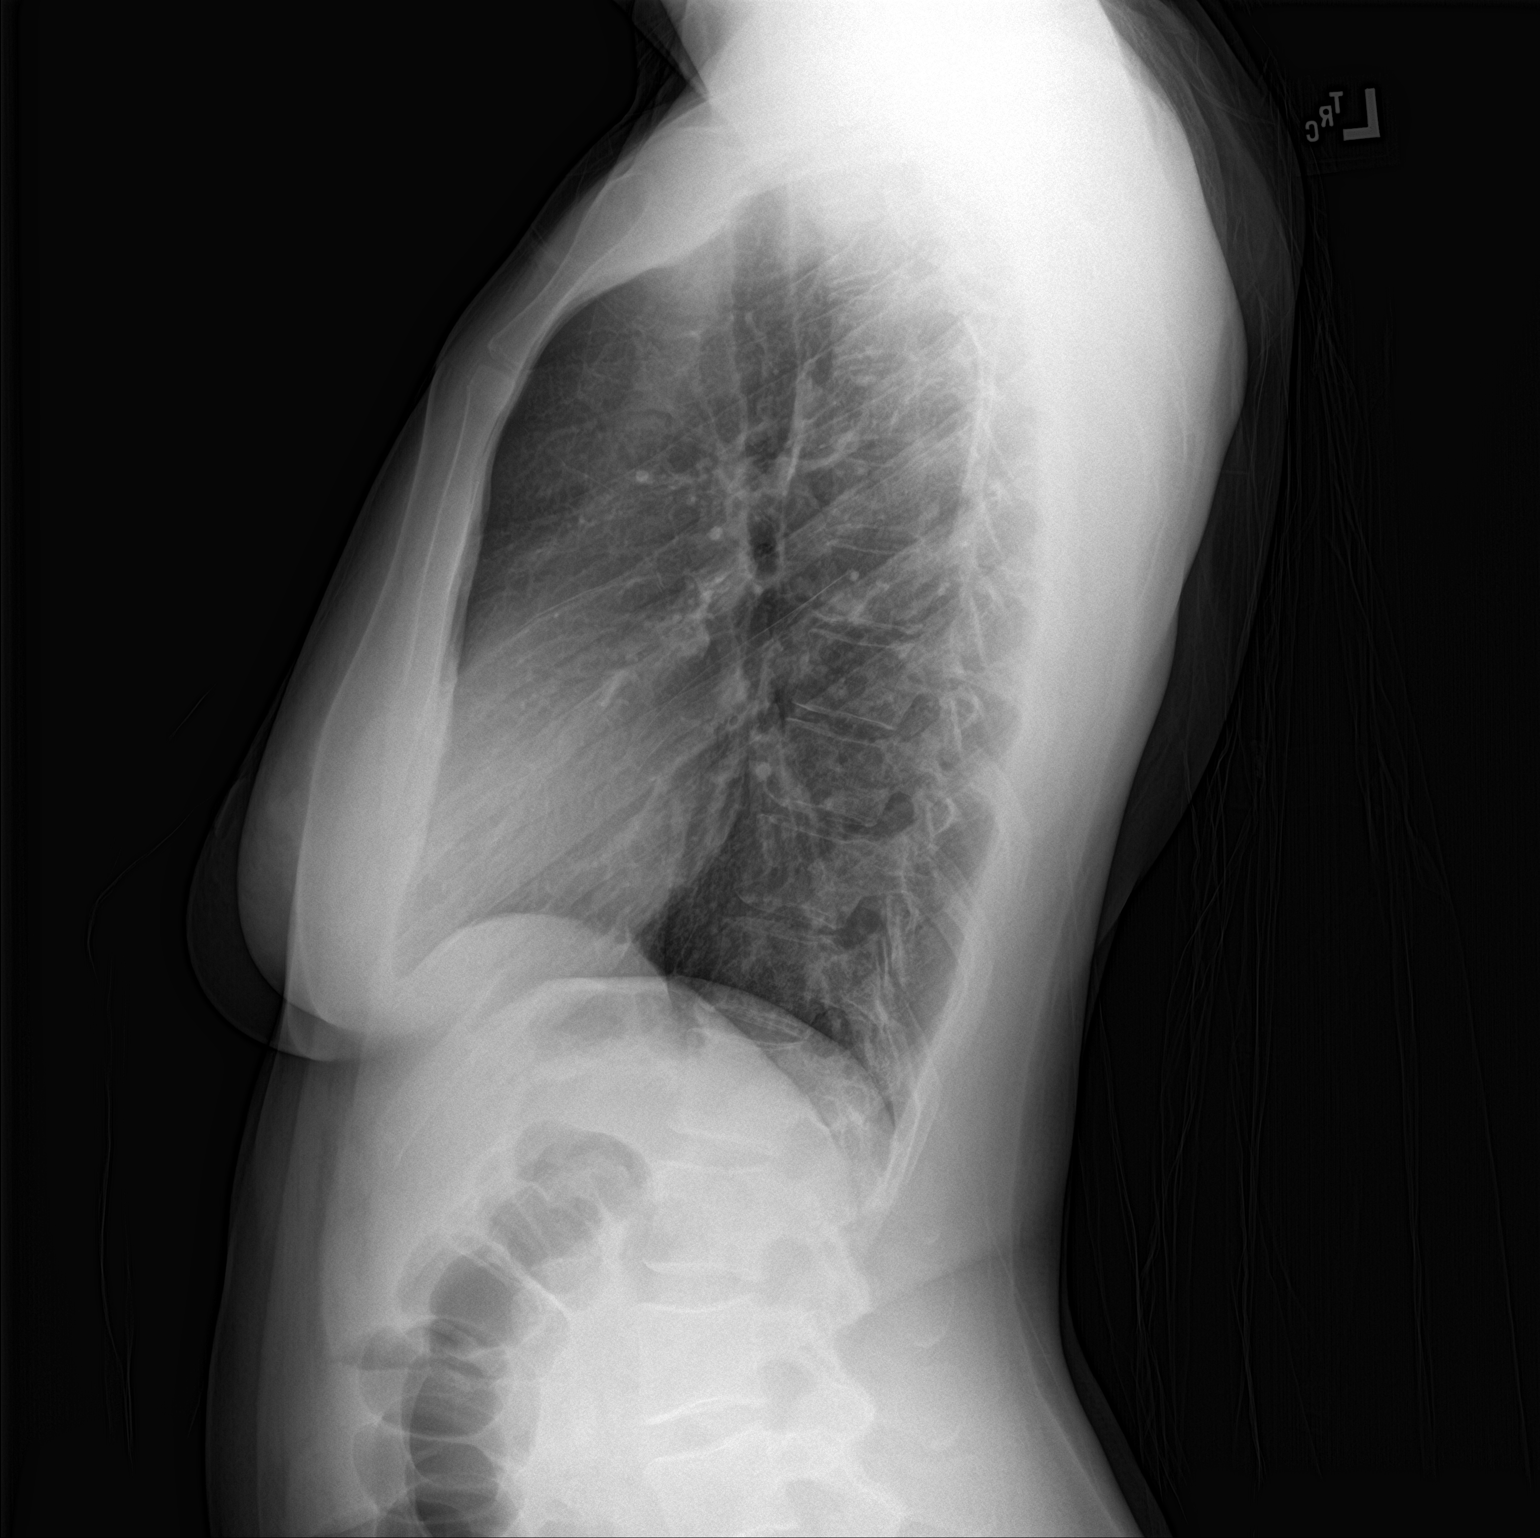

[2 of 2 positions shown; findings below may reference images not displayed]

FINDINGS: The heart size and mediastinal contours are within normal limits.
Both lungs are clear. No pleural effusion. The visualized skeletal
structures are unremarkable.
IMPRESSION: No active cardiopulmonary disease.
